# Patient Record
Sex: Male | Born: 1985 | Race: Black or African American | Hispanic: No | Marital: Single | Smoking: Current some day smoker
Health system: Southern US, Community
[De-identification: ages and names within clinical notes are randomized; demographics above are authoritative.]

## PROBLEM LIST (undated history)

## (undated) HISTORY — PX: MOUTH SURGERY: SHX715

## (undated) HISTORY — PX: OTHER SURGICAL HISTORY: SHX169

---
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
## 2010-07-11 ENCOUNTER — Emergency Department: Payer: Self-pay | Admitting: Emergency Medicine

## 2010-09-15 ENCOUNTER — Emergency Department: Payer: Self-pay | Admitting: Emergency Medicine

## 2011-02-14 IMAGING — CR RIGHT FOOT COMPLETE - 3+ VIEW
1 series · 3 of 3 positions shown · non-contrast
Comparison: none

REASON FOR EXAM: pain
COMMENTS:

PROCEDURE:     DXR - DXR FOOT RT COMPLETE W/OBLIQUES  - July 11, 2010 [DATE]
RESULT:     There is no evidence of fracture, dislocation, or malalignment.

[Series 1: view not recorded · 0.17mm/px · 3 of 3 slices shown]
[im 1/3]
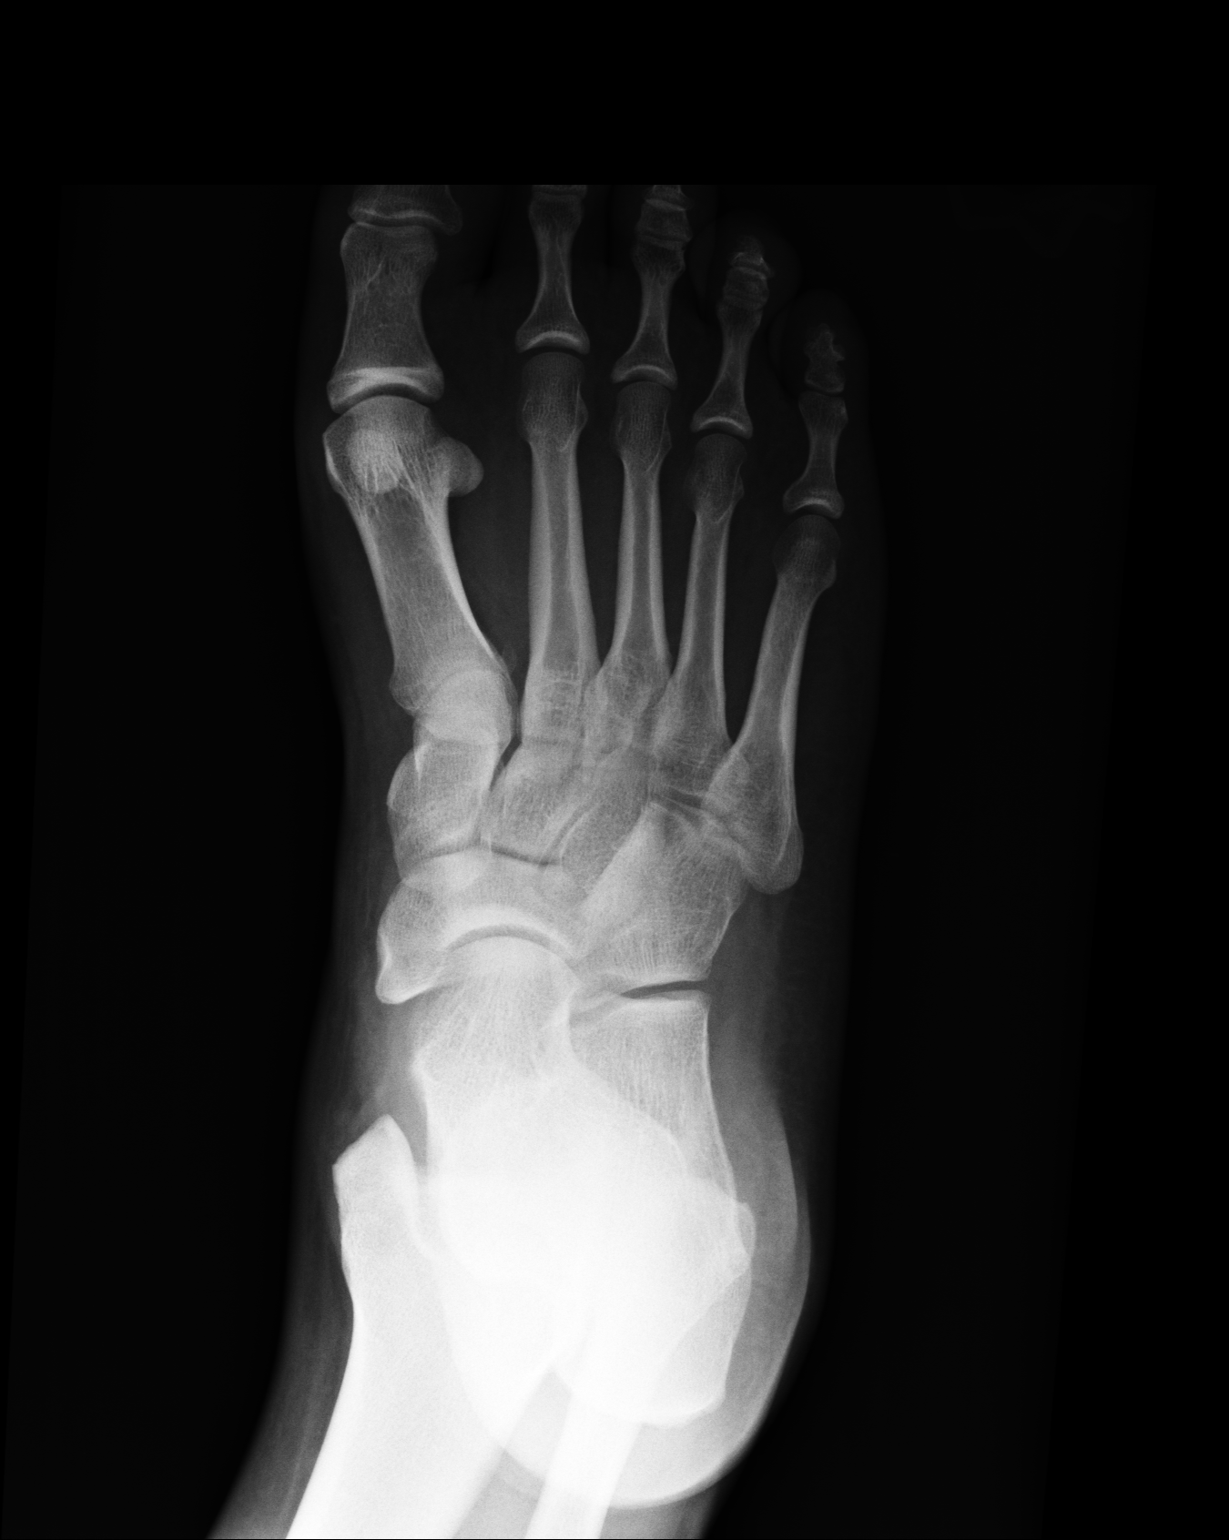
[im 2/3]
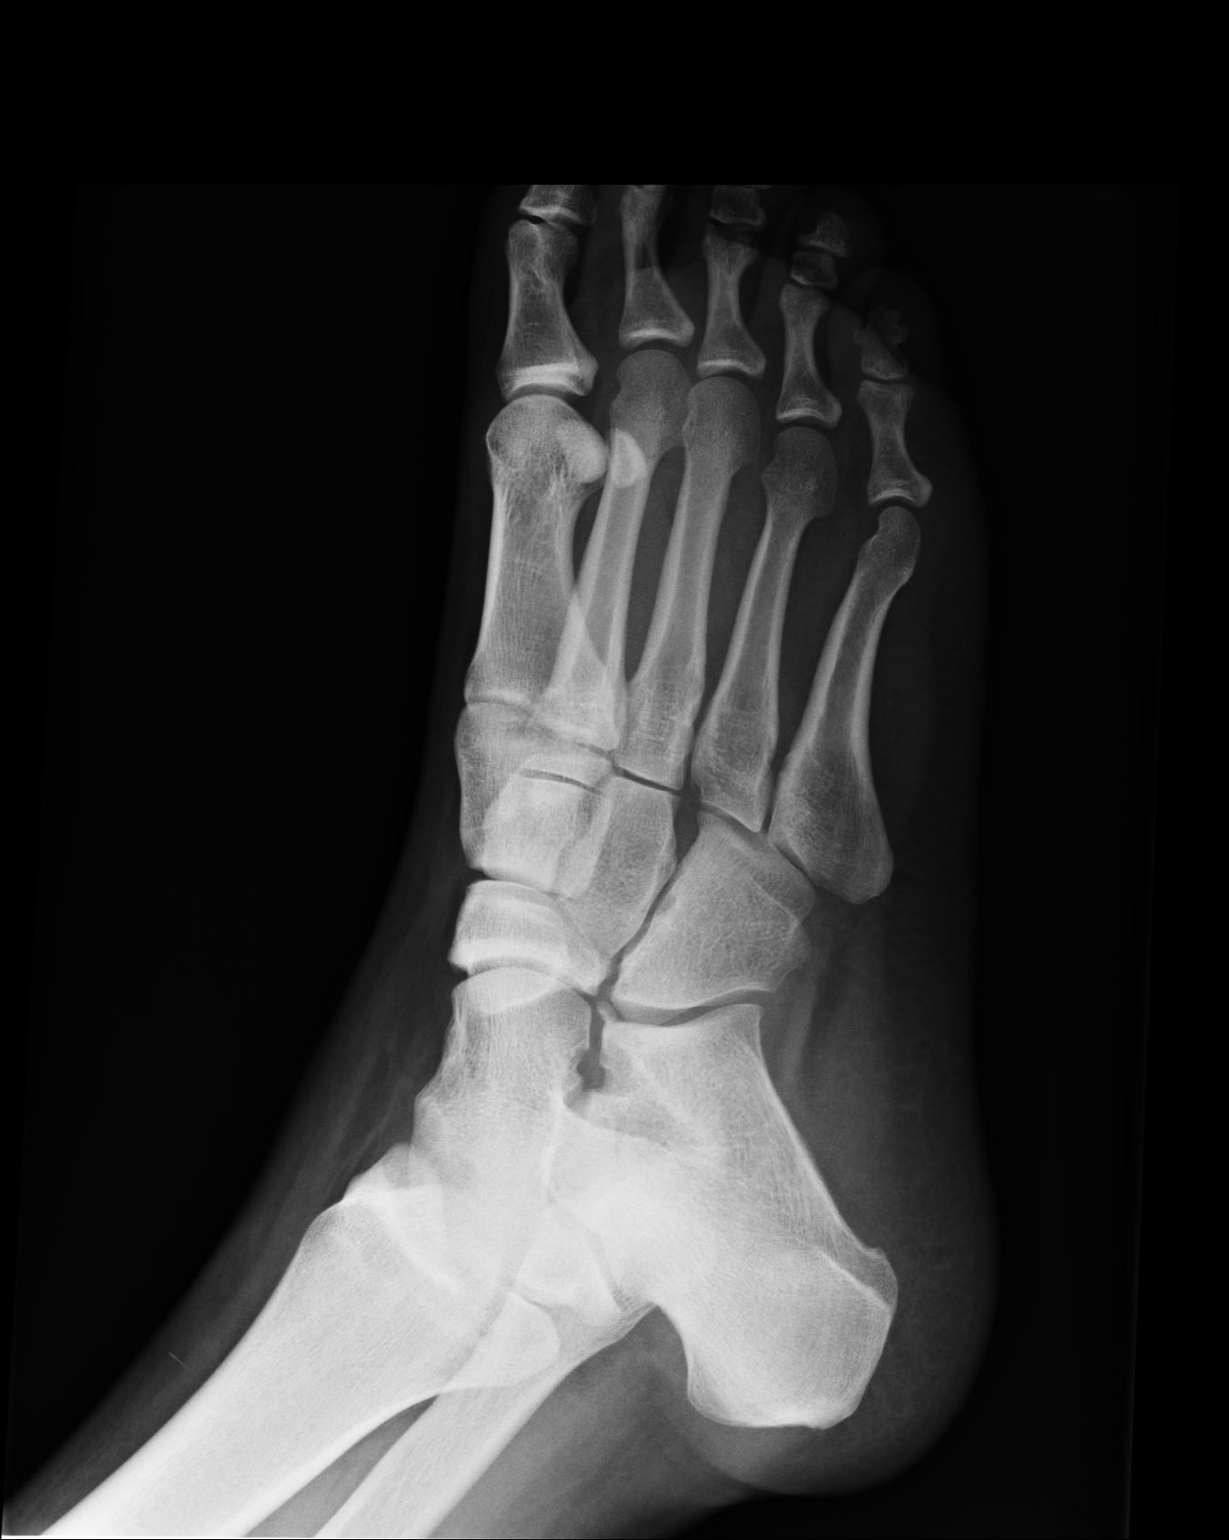
[im 3/3]
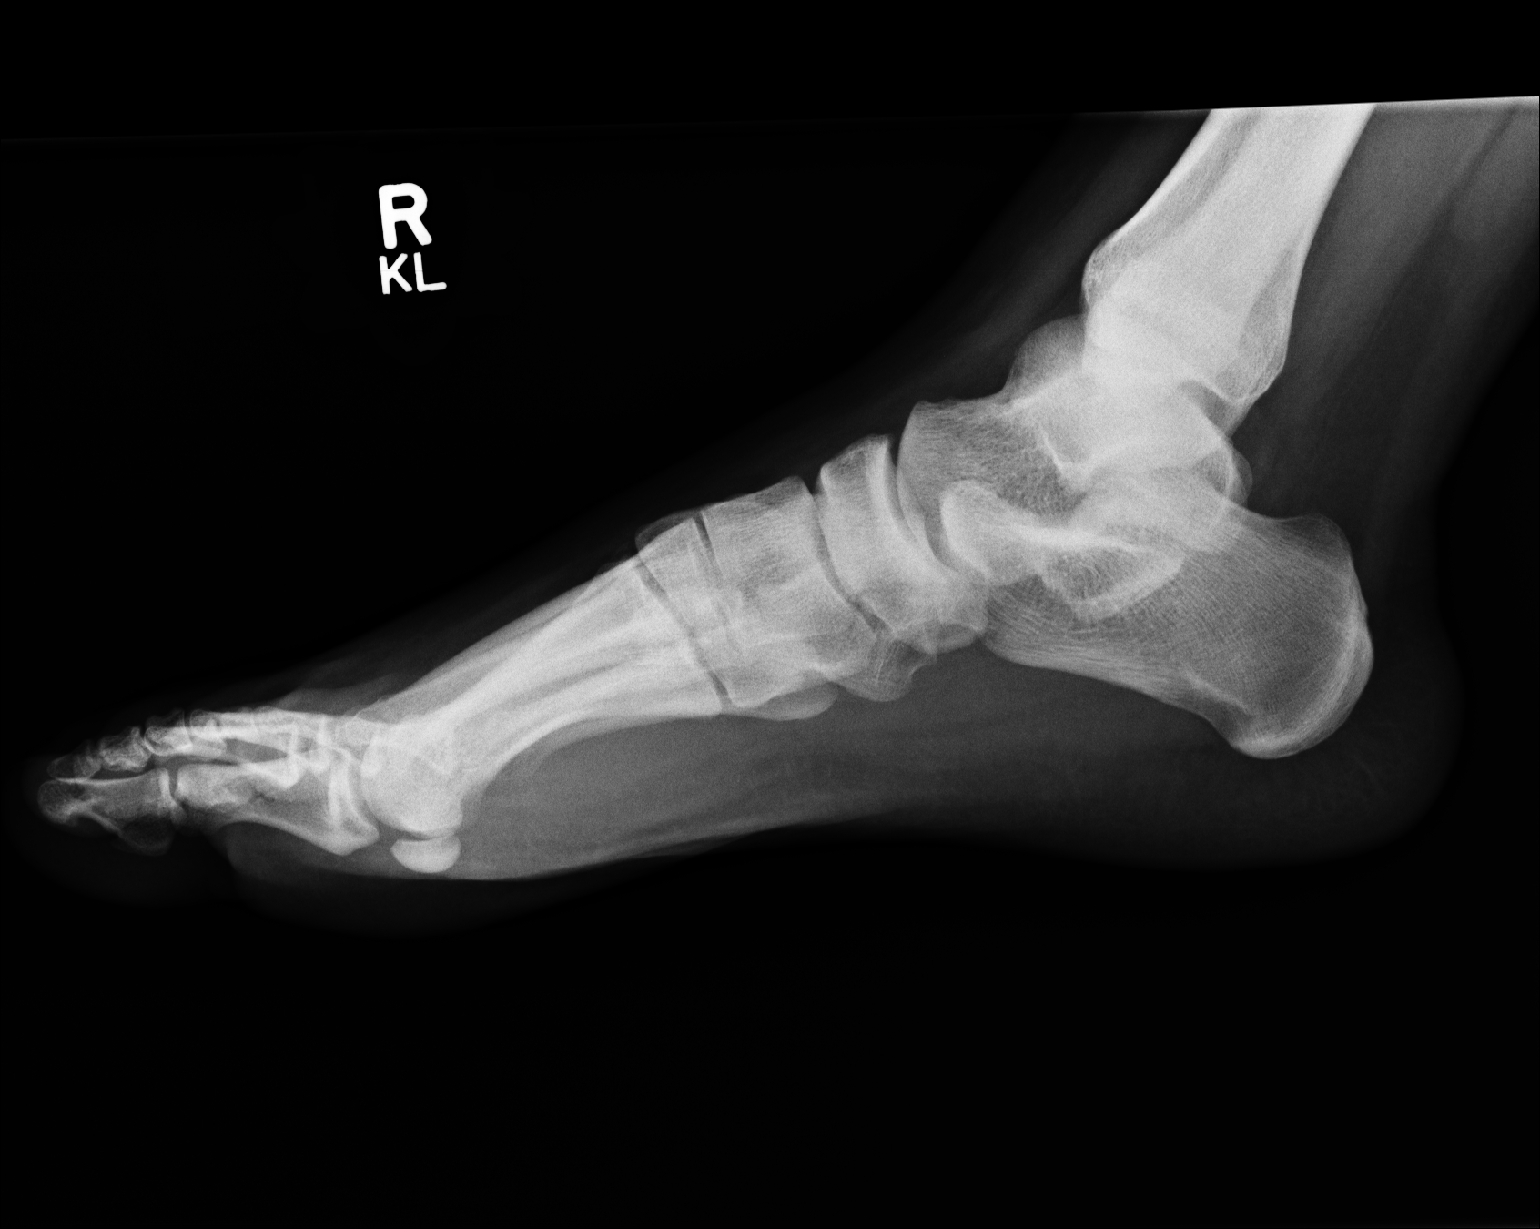

[3 of 3 positions shown; findings below may reference images not displayed]

IMPRESSION: 1. No evidence of acute abnormalities.
2. If there are persistent complaints of pain or persistent clinical
concern, a repeat evaluation in 7-10 days is recommended if clinically
warranted.

## 2013-08-07 ENCOUNTER — Emergency Department: Payer: Self-pay | Admitting: Emergency Medicine

## 2013-08-07 LAB — URINALYSIS, COMPLETE
Bacteria: NONE SEEN
Bilirubin,UR: NEGATIVE
Glucose,UR: NEGATIVE mg/dL (ref 0–75)
Ketone: NEGATIVE
Leukocyte Esterase: NEGATIVE
Nitrite: NEGATIVE
Ph: 6 (ref 4.5–8.0)
Protein: NEGATIVE
RBC,UR: <1 /HPF (ref 0–5)
Specific Gravity: 1.023 (ref 1.003–1.030)

## 2013-08-07 LAB — GC/CHLAMYDIA PROBE AMP

## 2014-07-14 ENCOUNTER — Emergency Department: Payer: Self-pay | Admitting: Emergency Medicine

## 2014-07-14 LAB — TROPONIN I

## 2014-07-14 LAB — CBC
HCT: 44 % (ref 40.0–52.0)
HGB: 14.8 g/dL (ref 13.0–18.0)
MCH: 29 pg (ref 26.0–34.0)
MCHC: 33.5 g/dL (ref 32.0–36.0)
MCV: 87 fL (ref 80–100)
PLATELETS: 263 10*3/uL (ref 150–440)
RBC: 5.09 10*6/uL (ref 4.40–5.90)
RDW: 13.5 % (ref 11.5–14.5)
WBC: 6.7 10*3/uL (ref 3.8–10.6)

## 2014-07-14 LAB — COMPREHENSIVE METABOLIC PANEL
ALBUMIN: 3.5 g/dL (ref 3.4–5.0)
ALK PHOS: 62 U/L
ANION GAP: 10 (ref 7–16)
BUN: 19 mg/dL — ABNORMAL HIGH (ref 7–18)
Bilirubin,Total: 0.7 mg/dL (ref 0.2–1.0)
Calcium, Total: 8.7 mg/dL (ref 8.5–10.1)
Chloride: 108 mmol/L — ABNORMAL HIGH (ref 98–107)
Co2: 24 mmol/L (ref 21–32)
Creatinine: 1.23 mg/dL (ref 0.60–1.30)
EGFR (African American): >60
GLUCOSE: 97 mg/dL (ref 65–99)
Osmolality: 285 (ref 275–301)
Potassium: 4 mmol/L (ref 3.5–5.1)
SGOT(AST): 20 U/L (ref 15–37)
SGPT (ALT): 20 U/L
Sodium: 142 mmol/L (ref 136–145)
Total Protein: 7.7 g/dL (ref 6.4–8.2)

## 2015-12-25 ENCOUNTER — Emergency Department
Admission: EM | Admit: 2015-12-25 | Discharge: 2015-12-26 | Disposition: A | Discharge status: Home / Self Care | Payer: Self-pay | Attending: Emergency Medicine | Admitting: Emergency Medicine

## 2015-12-25 ENCOUNTER — Encounter: Payer: Self-pay | Admitting: Emergency Medicine

## 2015-12-25 DIAGNOSIS — F17210 Nicotine dependence, cigarettes, uncomplicated: Secondary | ICD-10-CM | POA: Insufficient documentation

## 2015-12-25 DIAGNOSIS — K047 Periapical abscess without sinus: Secondary | ICD-10-CM | POA: Insufficient documentation

## 2015-12-25 NOTE — ED Notes (Signed)
Pt took 3  tylenol at 5pm. Did not relieve pain.

## 2015-12-25 NOTE — ED Notes (Signed)
Pt c/o pain above top right teeth; says he feels a knot there; unable to sleep last night due to pain; denies fever; no acute distress

## 2015-12-26 MED ORDER — HYDROCODONE-ACETAMINOPHEN 7.5-325 MG PO TABS
1.0000 | ORAL_TABLET | Freq: Once | ORAL | Status: AC
  Administered 2015-12-26: 1 via ORAL
  Filled 2015-12-26: qty 1

## 2015-12-26 MED ORDER — HYDROCODONE-ACETAMINOPHEN 5-325 MG PO TABS: 1.0000 | ORAL_TABLET | Freq: Four times a day (QID) | ORAL | Status: AC | PRN

## 2015-12-26 MED ORDER — IBUPROFEN 800 MG PO TABS: 800.0000 mg | ORAL_TABLET | Freq: Three times a day (TID) | ORAL | Status: AC | PRN

## 2015-12-26 MED ORDER — PENICILLIN V POTASSIUM 500 MG PO TABS: 500.0000 mg | ORAL_TABLET | Freq: Four times a day (QID) | ORAL | Status: AC

## 2015-12-26 MED ORDER — IBUPROFEN 600 MG PO TABS
600.0000 mg | ORAL_TABLET | Freq: Once | ORAL | Status: AC
  Administered 2015-12-26: 600 mg via ORAL
  Filled 2015-12-26: qty 1

## 2015-12-26 NOTE — Discharge Instructions (Signed)
Dental Abscess A dental abscess is a collection of pus in or around a tooth. CAUSES This condition is caused by a bacterial infection around the root of the tooth that involves the inner part of the tooth (pulp). It may result from:  Severe tooth decay.  Trauma to the tooth that allows bacteria to enter into the pulp, such as a broken or chipped tooth.  Severe gum disease around a tooth. SYMPTOMS Symptoms of this condition include:  Severe pain in and around the infected tooth.  Swelling and redness around the infected tooth, in the mouth, or in the face.  Tenderness.  Pus drainage.  Bad breath.  Bitter taste in the mouth.  Difficulty swallowing.  Difficulty opening the mouth.  Nausea.  Vomiting.  Chills.  Swollen neck glands.  Fever. DIAGNOSIS This condition is diagnosed with examination of the infected tooth. During the exam, your dentist may tap on the infected tooth. Your dentist will also ask about your medical and dental history and may order X-rays. TREATMENT This condition is treated by eliminating the infection. This may be done with:  Antibiotic medicine.  A root canal. This may be performed to save the tooth.  Pulling (extracting) the tooth. This may also involve draining the abscess. This is done if the tooth cannot be saved. HOME CARE INSTRUCTIONS  Take medicines only as directed by your dentist.  If you were prescribed antibiotic medicine, finish all of it even if you start to feel better.  Rinse your mouth (gargle) often with salt water to relieve pain or swelling.  Do not drive or operate heavy machinery while taking pain medicine.  Do not apply heat to the outside of your mouth.  Keep all follow-up visits as directed by your dentist. This is important. SEEK MEDICAL CARE IF:  Your pain is worse and is not helped by medicine. SEEK IMMEDIATE MEDICAL CARE IF:  You have a fever or chills.  Your symptoms suddenly get worse.  You have a  very bad headache.  You have problems breathing or swallowing.  You have trouble opening your mouth.  You have swelling in your neck or around your eye.   This information is not intended to replace advice given to you by your health care provider. Make sure you discuss any questions you have with your health care provider.   Document Released: 11/06/2005 Document Revised: 03/23/2015 Document Reviewed: 11/03/2014 Elsevier Interactive Patient Education [employer]  Please return immediately if condition worsens. Please contact her primary physician or the physician you were given for referral. If you have any specialist physicians involved in her treatment and plan please also contact them. Thank you for using [facility].

## 2015-12-26 NOTE — ED Notes (Signed)
Pt discharged to home.  Family member driving.  Discharge instructions reviewed.  Verbalized understanding.  No questions or concerns at this time.  Teach back verified.  Pt in NAD.  No items left in ED.   

## 2015-12-26 NOTE — ED Provider Notes (Signed)
Time Seen: Approximately 0005  I have reviewed the triage notes  Chief Complaint: Dental Pain   History of Present Illness: Troy Perkins is a 30 y.o. male who presents with recent onset at earlier today of some right-sided upper dental pain. Patient's noticed some pain with pressing on this area and states pain will radiate up into the right side of his nose up toward his right eye. He is not aware of any facial swelling. He denies any fever, trouble with bite, or swallowing. He denies any other dental pain. He states is been approximately 12 years since he seen a dentist.   History reviewed. No pertinent past medical history.  There are no active problems to display for this patient.   Past Surgical History  Procedure Laterality Date  . Mouth surgery    . Broken jaw      Past Surgical History  Procedure Laterality Date  . Mouth surgery    . Broken jaw      Current Outpatient Rx  Name  Route  Sig  Dispense  Refill  . HYDROcodone-acetaminophen (NORCO) 5-325 MG tablet   Oral   Take 1 tablet by mouth every 6 (six) hours as needed for moderate pain.   20 tablet   0   . ibuprofen (ADVIL,MOTRIN) 800 MG tablet   Oral   Take 1 tablet (800 mg total) by mouth every 8 (eight) hours as needed.   30 tablet   0   . penicillin v potassium (VEETID) 500 MG tablet   Oral   Take 1 tablet (500 mg total) by mouth 4 (four) times daily.   28 tablet   0     Allergies:  Review of patient's allergies indicates not on file.  Family History: History reviewed. No pertinent family history.  Social History: Social History  Substance Use Topics  . Smoking status: Current Some Day Smoker    Types: Cigars  . Smokeless tobacco: None  . Alcohol Use: Yes     Comment: twice a year     Review of Systems:   Constitutional: No fever Eyes: No visual disturbances ENT: No sore throat, ear pain Cardiac: No chest pain Respiratory: No shortness of breath, wheezing, or stridor  Physical  Exam:  ED Triage Vitals  Enc Vitals Group     BP 12/25/15 2342 158/111 mmHg     Pulse Rate 12/25/15 2342 80     Resp 12/25/15 2342 18     Temp 12/25/15 2342 98.9 F (37.2 C)     Temp Source 12/25/15 2342 Oral     SpO2 12/25/15 2342 98 %     Weight 12/25/15 2342 225 lb (102.059 kg)     Height 12/25/15 2342  (1.753 m)     Head Cir --      Peak Flow --      Pain Score 12/25/15 2343 10     Pain Loc --      Pain Edu? --      Excl. in GC? --     General: Awake , Alert , and Oriented times 3; GCS 15 Head: Normal cephalic , atraumatic Eyes: Pupils equal , round, reactive to light Nose/Throat: No nasal drainage, patent upper airway without erythema or exudate. Close examination of the right upper incisor shows some pain that's reproducible with some swelling at the base of the dentition. The tooth is stable in the socket. There is no other surrounding, or facial swelling. Neck: Supple, Full range  of motion, No anterior adenopathy or palpable thyroid masses Lungs: Clear to ascultation without wheezes , rhonchi, or rales Heart: Regular rate, regular rhythm without murmurs , gallops , or rubs Abdomen: Soft, non tender without rebound, guarding , or rigidity; bowel sounds positive and symmetric in all 4 quadrants. No organomegaly .        Extremities: 2 plus symmetric pulses. No edema, clubbing or cyanosis Neurologic: normal ambulation, Motor symmetric without deficits, sensory intact Skin: warm, dry, no rashes     ED Course: Patient's stay here was uneventful and he was given Vicodin here along with a ibuprofen tablet for pain. Patient was given referral to dental clinics in the area. He was advised to return here if he develops a fever or facial swelling. And was discharged with dental abscess instructions.    Assessment:  Right-sided upper incisor dental abscess   Final Clinical Impression:   Final diagnoses:  Dental abscess     Plan:  Outpatient management Patient  was prescribed penicillin, Norco, and prescription strength ibuprofen. He was given referral to dental clinics in the area.            Jennye Moccasin, MD 12/26/15 [phone]

## 2017-02-07 ENCOUNTER — Emergency Department
Admission: EM | Admit: 2017-02-07 | Discharge: 2017-02-07 | Disposition: A | Discharge status: Home / Self Care | Payer: Self-pay | Attending: Emergency Medicine | Admitting: Emergency Medicine

## 2017-02-07 ENCOUNTER — Encounter: Payer: Self-pay | Admitting: Emergency Medicine

## 2017-02-07 ENCOUNTER — Other Ambulatory Visit: Payer: Self-pay

## 2017-02-07 DIAGNOSIS — R0789 Other chest pain: Secondary | ICD-10-CM | POA: Insufficient documentation

## 2017-02-07 DIAGNOSIS — Z5321 Procedure and treatment not carried out due to patient leaving prior to being seen by health care provider: Secondary | ICD-10-CM | POA: Insufficient documentation

## 2017-02-07 DIAGNOSIS — F17290 Nicotine dependence, other tobacco product, uncomplicated: Secondary | ICD-10-CM | POA: Insufficient documentation

## 2017-02-07 LAB — TROPONIN I

## 2017-02-07 NOTE — ED Triage Notes (Signed)
Pt presents to ED c/o frequent headaches with known hx of migraines. Headaches are more continuous with pressure on the right side of head. Pt also states that he had tightness in chest that began this morning, intermittent.

## 2017-02-07 NOTE — ED Notes (Signed)
Pt called twice for a room with no answer.

## 2019-10-30 ENCOUNTER — Other Ambulatory Visit: Payer: Self-pay

## 2019-10-30 DIAGNOSIS — Z20822 Contact with and (suspected) exposure to covid-19: Secondary | ICD-10-CM

## 2019-11-01 LAB — NOVEL CORONAVIRUS, NAA: SARS-CoV-2, NAA: NOT DETECTED
